# Patient Record
Sex: Female | Born: 1995 | Race: White | Hispanic: No | Marital: Single | Smoking: Never smoker
Health system: Southern US, Community
[De-identification: ages and names within clinical notes are randomized; demographics above are authoritative.]

## PROBLEM LIST (undated history)

## (undated) HISTORY — PX: DENTAL SURGERY: SHX609

## (undated) HISTORY — PX: MYRINGOTOMY WITH GELFILM: SHX6572

---
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
## 2017-02-05 ENCOUNTER — Encounter (HOSPITAL_COMMUNITY): Payer: Self-pay | Admitting: Emergency Medicine

## 2017-02-05 ENCOUNTER — Emergency Department (HOSPITAL_COMMUNITY)
Admission: EM | Admit: 2017-02-05 | Discharge: 2017-02-05 | Disposition: A | Discharge status: Home Health | Payer: Medicaid Other | Attending: Emergency Medicine | Admitting: Emergency Medicine

## 2017-02-05 DIAGNOSIS — K047 Periapical abscess without sinus: Secondary | ICD-10-CM

## 2017-02-05 DIAGNOSIS — K0889 Other specified disorders of teeth and supporting structures: Secondary | ICD-10-CM | POA: Diagnosis present

## 2017-02-05 MED ORDER — PENICILLIN V POTASSIUM 500 MG PO TABS: 500.0000 mg | ORAL_TABLET | Freq: Three times a day (TID) | ORAL | 0 refills | Status: DC

## 2017-02-05 MED ORDER — NAPROXEN 500 MG PO TABS: 500.0000 mg | ORAL_TABLET | Freq: Two times a day (BID) | ORAL | 0 refills | Status: DC

## 2017-02-05 MED ORDER — TRAMADOL HCL 50 MG PO TABS: 50.0000 mg | ORAL_TABLET | Freq: Four times a day (QID) | ORAL | 0 refills | Status: DC | PRN

## 2017-02-05 NOTE — Discharge Instructions (Signed)
Please read and follow all provided instructions.  Your diagnoses today include:  1. Abscess, dental     The exam and treatment you received today has been provided on an emergency basis only. This is not a substitute for complete medical or dental care.  Tests performed today include:  Vital signs. See below for your results today.   Medications prescribed:   Penicillin - antibiotic  You have been prescribed an antibiotic medicine: take the entire course of medicine even if you are feeling better. Stopping early can cause the antibiotic not to work.   Naproxen - anti-inflammatory pain medication  Do not exceed 500mg  naproxen every 12 hours, take with food  You have been prescribed an anti-inflammatory medication or NSAID. Take with food. Take smallest effective dose for the shortest duration needed for your pain. Stop taking if you experience stomach pain or vomiting.    Tramadol - narcotic-like pain medication  DO NOT drive or perform any activities that require you to be awake and alert because this medicine can make you drowsy.   Take any prescribed medications only as directed.  Home care instructions:  Follow any educational materials contained in this packet.  Follow-up instructions: Please follow-up with your dentist as planned for further evaluation of your symptoms.   Return instructions:   Please return to the Emergency Department if you experience worsening symptoms.  Please return if you develop a fever, you develop more swelling in your face or neck, you have trouble breathing or swallowing food.  Please return if you have any other emergent concerns.  Additional Information:  Your vital signs today were: BP 121/72 (BP Location: Right Arm)    Pulse 99    Temp 98.2 F (36.8 C) (Oral)    Resp 18    Ht 5\' 5"  (1.651 m)    Wt 59 kg (130 lb)    LMP 01/22/2017    SpO2 99%    BMI 21.63 kg/m  If your blood pressure (BP) was elevated above 135/85 this visit,  please have this repeated by your doctor within one month. --------------

## 2017-02-05 NOTE — ED Notes (Signed)
Pt states that her thyroid and throat is swollen wants the EDP to look at it before she leaves.  EDP notified and is at bedside.

## 2017-02-05 NOTE — ED Triage Notes (Signed)
PT c/o left upper dental pain with some facial swelling x2 days. PT states she has a dentist appt for Tuesday this upcoming week.

## 2017-02-05 NOTE — ED Provider Notes (Signed)
[facility]-EMERGENCY DEPT Provider Note   CSN: [id number] Arrival date & time: 02/05/17  1016     History   Chief Complaint Chief Complaint  Patient presents with  . Dental Pain    HPI Hannah Simon is a 21 y.o. female.  Patient presents with complaint of right upper gum and face swelling for 2 days, symptoms were worse this morning. No neck swelling, difficulty breathing, swallowing. No fevers or vomiting. Patient treating at home with over-the-counter medications. Patient has follow-up with dentist in 3 days.      History reviewed. No pertinent past medical history.  There are no active problems to display for this patient.   Past Surgical History:  Procedure Laterality Date  . DENTAL SURGERY    . MYRINGOTOMY WITH GELFILM      OB History    Gravida Para Term Preterm AB Living   0 0 0 0 0 0   SAB TAB Ectopic Multiple Live Births   0 0 0 0 0       Home Medications    Prior to Admission medications   Medication Sig Start Date End Date Taking? Authorizing Provider  naproxen (NAPROSYN) 500 MG tablet Take 1 tablet (500 mg total) by mouth 2 (two) times daily. 02/05/17   Renne Crigler, PA-C  penicillin v potassium (VEETID) 500 MG tablet Take 1 tablet (500 mg total) by mouth 3 (three) times daily. 02/05/17   Renne Crigler, PA-C  traMADol (ULTRAM) 50 MG tablet Take 1 tablet (50 mg total) by mouth every 6 (six) hours as needed. 02/05/17   Renne Crigler, PA-C    Family History History reviewed. No pertinent family history.  Social History Social History  Substance Use Topics  . Smoking status: Never Smoker  . Smokeless tobacco: Never Used  . Alcohol use No     Allergies   Patient has no known allergies.   Review of Systems Review of Systems  Constitutional: Negative for fever.  HENT: Positive for dental problem and facial swelling. Negative for ear pain, sore throat and trouble swallowing.   Respiratory: Negative for shortness of breath and stridor.     Musculoskeletal: Negative for neck pain.  Skin: Negative for color change.  Neurological: Negative for headaches.     Physical Exam Updated Vital Signs BP 121/72 (BP Location: Right Arm)   Pulse 99   Temp 98.2 F (36.8 C) (Oral)   Resp 18   Ht 5\' 5"  (1.651 m)   Wt 59 kg (130 lb)   LMP 01/22/2017   SpO2 99%   BMI 21.63 kg/m   Physical Exam  Constitutional: She appears well-developed and well-nourished.  HENT:  Head: Normocephalic and atraumatic.  Right Ear: Tympanic membrane, external ear and ear canal normal.  Left Ear: Tympanic membrane, external ear and ear canal normal.  Nose: Nose normal.  Mouth/Throat: Uvula is midline, oropharynx is clear and moist and mucous membranes are normal. No trismus in the jaw. Abnormal dentition. Dental abscesses (Pustule noted of gums at approximately tooth #11) and dental caries present. No uvula swelling. No tonsillar abscesses.  Trace left upper cheek swelling  Eyes: Conjunctivae are normal.  Neck: Normal range of motion. Neck supple.  No neck swelling or Ludwig's angina  Lymphadenopathy:    She has no cervical adenopathy.  Neurological: She is alert.  Skin: Skin is warm and dry.  Psychiatric: She has a normal mood and affect.  Nursing note and vitals reviewed.    ED Treatments / Results  Procedures Procedures (including critical care time)  Medications Ordered in ED Medications - No data to display   Initial Impression / Assessment and Plan / ED Course  I have reviewed the triage vital signs and the nursing notes.  Pertinent labs & imaging results that were available during my care of the patient were reviewed by me and considered in my medical decision making (see chart for details).     11:22 AM Patient seen and examined.   Vital signs reviewed and are as follows: BP 121/72 (BP Location: Right Arm)   Pulse 99   Temp 98.2 F (36.8 C) (Oral)   Resp 18   Ht 5\' 5"  (1.651 m)   Wt 59 kg (130 lb)   LMP 01/22/2017    SpO2 99%   BMI 21.63 kg/m   Patient counseled on use of narcotic pain medications. Counseled not to combine these medications with others containing tylenol. Urged not to drink alcohol, drive, or perform any other activities that requires focus while taking these medications. The patient verbalizes understanding and agrees with the plan.  Patient counseled to take prescribed medications as directed, return with worsening facial or neck swelling, and to follow-up with their dentist as soon as possible.    Final Clinical Impressions(s) / ED Diagnoses   Final diagnoses:  Abscess, dental   Patient with toothache. No fever. Exam unconcerning for Ludwig's angina or other deep tissue infection in neck.   As there is gum swelling, erythema, and facial swelling, will treat with antibiotic and pain medicine. Urged patient to follow-up with dentist.    New Prescriptions New Prescriptions   NAPROXEN (NAPROSYN) 500 MG TABLET    Take 1 tablet (500 mg total) by mouth 2 (two) times daily.   PENICILLIN V POTASSIUM (VEETID) 500 MG TABLET    Take 1 tablet (500 mg total) by mouth 3 (three) times daily.   TRAMADOL (ULTRAM) 50 MG TABLET    Take 1 tablet (50 mg total) by mouth every 6 (six) hours as needed.     Renne CriglerGeiple, Annahi Short, PA-C 02/05/17 1122    Pricilla LovelessGoldston, Scott, MD 02/14/17 [phone]

## 2019-06-20 ENCOUNTER — Telehealth: Payer: Self-pay | Admitting: Advanced Practice Midwife

## 2019-06-20 NOTE — Telephone Encounter (Signed)
We have you scheduled for an upcoming appointment at our office. At this time, we are still not allowing visitors or children during your appointment, however, a support person, over age 22, may accompany you to your appointment if assistance is needed for safety or care concerns. Otherwise, support persons should remain outside until the visit is complete.  ° °We ask if you have had any exposure to anyone suspected or confirmed of having COVID-19 or if you are experiencing any of the following, to call and reschedule your appointment: fever, cough, shortness of breath, muscle pain, diarrhea, rash, vomiting, abdominal pain, red eye, weakness, bruising, bleeding, joint pain, or a severe headache.  ° °Please know we will ask you these questions or similar questions when you arrive for your appointment and again it’s how we are keeping everyone safe.   ° °Also,to keep you safe, please use the provided hand sanitizer when you enter the office. We are asking everyone in the office to wear a mask to help prevent the spread of °germs. If you have a mask of your own, please wear it to your appointment, if not, we are happy to provide one for you. ° °Thank you for understanding and your cooperation.  ° ° °[facility] Staff °

## 2019-06-21 ENCOUNTER — Other Ambulatory Visit: Payer: Self-pay | Admitting: Advanced Practice Midwife

## 2019-12-23 ENCOUNTER — Encounter (HOSPITAL_COMMUNITY): Payer: Self-pay | Admitting: Emergency Medicine

## 2019-12-23 ENCOUNTER — Emergency Department (HOSPITAL_COMMUNITY)
Admission: EM | Admit: 2019-12-23 | Discharge: 2019-12-23 | Disposition: A | Discharge status: Home / Self Care | Payer: Medicaid Other | Attending: Emergency Medicine | Admitting: Emergency Medicine

## 2019-12-23 ENCOUNTER — Emergency Department (HOSPITAL_COMMUNITY): Payer: Medicaid Other

## 2019-12-23 ENCOUNTER — Other Ambulatory Visit: Payer: Self-pay

## 2019-12-23 DIAGNOSIS — N838 Other noninflammatory disorders of ovary, fallopian tube and broad ligament: Secondary | ICD-10-CM | POA: Insufficient documentation

## 2019-12-23 DIAGNOSIS — N949 Unspecified condition associated with female genital organs and menstrual cycle: Secondary | ICD-10-CM

## 2019-12-23 LAB — COMPREHENSIVE METABOLIC PANEL
ALT: 22 U/L (ref 0–44)
AST: 23 U/L (ref 15–41)
Albumin: 4.8 g/dL (ref 3.5–5.0)
Alkaline Phosphatase: 60 U/L (ref 38–126)
Anion gap: 9 (ref 5–15)
BUN: 9 mg/dL (ref 6–20)
CO2: 25 mmol/L (ref 22–32)
Calcium: 9.4 mg/dL (ref 8.9–10.3)
Chloride: 103 mmol/L (ref 98–111)
Creatinine, Ser: 0.59 mg/dL (ref 0.44–1.00)
GFR calc Af Amer: >60 mL/min (ref >60)
GFR calc non Af Amer: >60 mL/min (ref >60)
Glucose, Bld: 100 mg/dL — ABNORMAL HIGH (ref 70–99)
Potassium: 3.7 mmol/L (ref 3.5–5.1)
Sodium: 137 mmol/L (ref 135–145)
Total Bilirubin: 0.8 mg/dL (ref 0.3–1.2)
Total Protein: 7.6 g/dL (ref 6.5–8.1)

## 2019-12-23 LAB — URINALYSIS, ROUTINE W REFLEX MICROSCOPIC
Bilirubin Urine: NEGATIVE
Glucose, UA: NEGATIVE mg/dL
Hgb urine dipstick: NEGATIVE
Ketones, ur: NEGATIVE mg/dL
Leukocytes,Ua: NEGATIVE
Nitrite: NEGATIVE
Protein, ur: NEGATIVE mg/dL
Specific Gravity, Urine: 1.019 (ref 1.005–1.030)
pH: 5 (ref 5.0–8.0)

## 2019-12-23 LAB — CBC WITH DIFFERENTIAL/PLATELET
Abs Immature Granulocytes: 0.02 10*3/uL (ref 0.00–0.07)
Basophils Absolute: 0.1 10*3/uL (ref 0.0–0.1)
Basophils Relative: 1 %
Eosinophils Absolute: 0.2 10*3/uL (ref 0.0–0.5)
Eosinophils Relative: 2 %
HCT: 40.6 % (ref 36.0–46.0)
Hemoglobin: 13.4 g/dL (ref 12.0–15.0)
Immature Granulocytes: 0 %
Lymphocytes Relative: 34 %
Lymphs Abs: 2.8 10*3/uL (ref 0.7–4.0)
MCH: 27.8 pg (ref 26.0–34.0)
MCHC: 33 g/dL (ref 30.0–36.0)
MCV: 84.2 fL (ref 80.0–100.0)
Monocytes Absolute: 0.8 10*3/uL (ref 0.1–1.0)
Monocytes Relative: 10 %
Neutro Abs: 4.4 10*3/uL (ref 1.7–7.7)
Neutrophils Relative %: 53 %
Platelets: 379 10*3/uL (ref 150–400)
RBC: 4.82 MIL/uL (ref 3.87–5.11)
RDW: 13.4 % (ref 11.5–15.5)
WBC: 8.3 10*3/uL (ref 4.0–10.5)
nRBC: 0 % (ref 0.0–0.2)

## 2019-12-23 LAB — LIPASE, BLOOD: Lipase: 36 U/L (ref 11–51)

## 2019-12-23 LAB — I-STAT BETA HCG BLOOD, ED (MC, WL, AP ONLY): I-stat hCG, quantitative: <5 m[IU]/mL (ref <5)

## 2019-12-23 MED ORDER — IOHEXOL 300 MG/ML  SOLN
100.0000 mL | Freq: Once | INTRAMUSCULAR | Status: AC | PRN
  Administered 2019-12-23: 100 mL via INTRAVENOUS

## 2019-12-23 MED ORDER — FENTANYL CITRATE (PF) 100 MCG/2ML IJ SOLN
100.0000 ug | Freq: Once | INTRAMUSCULAR | Status: AC
  Administered 2019-12-23: 100 ug via INTRAVENOUS
  Filled 2019-12-23: qty 2

## 2019-12-23 MED ORDER — KETOROLAC TROMETHAMINE 30 MG/ML IJ SOLN
30.0000 mg | Freq: Once | INTRAMUSCULAR | Status: AC
  Administered 2019-12-23: 02:00:00 30 mg via INTRAVENOUS
  Filled 2019-12-23: qty 1

## 2019-12-23 NOTE — ED Triage Notes (Signed)
Pt C/O RLQ abdominal pain and nausea X 3 months. Denies fevers at home.

## 2019-12-23 NOTE — ED Provider Notes (Signed)
[facility] EMERGENCY DEPARTMENT Provider Note   CSN: [id number] Arrival date & time: 12/23/19  0023     History Chief Complaint  Patient presents with  . Abdominal Pain    Hannah Simon is a 24 y.o. female.  The history is provided by the patient.  Abdominal Pain Pain location:  RLQ Pain quality: aching and sharp   Pain radiates to:  Does not radiate Pain severity:  Mild Onset quality:  Gradual Duration: 3 months. Timing:  Intermittent Progression:  Worsening Chronicity:  Recurrent Relieved by:  None tried Worsened by:  Nothing Ineffective treatments:  None tried Associated symptoms: constipation and nausea   Associated symptoms: no chest pain, no cough, no diarrhea, no dysuria, no fever, no vaginal bleeding, no vaginal discharge and no vomiting   Associated symptoms comment:  Chronic constipation  Risk factors: has not had multiple surgeries   Patient reports episodes of right lower quadrant abdominal pain for the past 3 months.  This episode worsened approximately 6 days ago.  She felt that it worsened at night and she is concerned something will rupture.  No fevers or vomiting.  No dysuria.  No vaginal bleeding. She is not currently sexually active. No concern for STDs     PMH-none surg hx - no abdomimal surgeries Past Surgical History:  Procedure Laterality Date  . DENTAL SURGERY    . MYRINGOTOMY WITH GELFILM       OB History    Gravida  0   Para  0   Term  0   Preterm  0   AB  0   Living  0     SAB  0   TAB  0   Ectopic  0   Multiple  0   Live Births  0           No family history on file.  Social History   Tobacco Use  . Smoking status: Never Smoker  . Smokeless tobacco: Never Used  Substance Use Topics  . Alcohol use: No  . Drug use: No    Home Medications Prior to Admission medications   Not on File    Allergies    Patient has no known allergies.  Review of Systems   Review of Systems  Constitutional:  Negative for fever.  Respiratory: Negative for cough.   Cardiovascular: Negative for chest pain.  Gastrointestinal: Positive for abdominal pain, constipation and nausea. Negative for diarrhea and vomiting.  Genitourinary: Negative for dysuria, vaginal bleeding and vaginal discharge.  All other systems reviewed and are negative.   Physical Exam Updated Vital Signs BP (!) 144/79   Pulse (!) 102   Temp 98 F (36.7 C) (Oral)   Resp 17   Wt 61.2 kg   LMP 11/26/2019   SpO2 97%   BMI 22.47 kg/m   Physical Exam CONSTITUTIONAL: Well developed/well nourished, anxious HEAD: Normocephalic/atraumatic EYES: EOMI/PERRL ENMT: Mucous membranes moist NECK: supple no meningeal signs SPINE/BACK:entire spine nontender CV: S1/S2 noted, no murmurs/rubs/gallops noted LUNGS: Lungs are clear to auscultation bilaterally, no apparent distress ABDOMEN: soft, mild RLQ tenderness, no rebound or guarding, bowel sounds noted throughout abdomen GU:no cva tenderness NEURO: Pt is awake/alert/appropriate, moves all extremitiesx4.  No facial droop.  EXTREMITIES: pulses normal/equal, full ROM SKIN: warm, color normal PSYCH: Anxious  ED Results / Procedures / Treatments   Labs (all labs ordered are listed, but only abnormal results are displayed) Labs Reviewed  COMPREHENSIVE METABOLIC PANEL - Abnormal; Notable for the following  components:      Result Value   Glucose, Bld 100 (*)    All other components within normal limits  CBC WITH DIFFERENTIAL/PLATELET  LIPASE, BLOOD  URINALYSIS, ROUTINE W REFLEX MICROSCOPIC  I-STAT BETA HCG BLOOD, ED ([facility], [facility], [facility] ONLY)    EKG None  Radiology CT ABDOMEN PELVIS W CONTRAST  Result Date: 12/23/2019 CLINICAL DATA:  Right lower quadrant pain. EXAM: CT ABDOMEN AND PELVIS WITH CONTRAST TECHNIQUE: Multidetector CT imaging of the abdomen and pelvis was performed using the standard protocol following bolus administration of intravenous contrast. CONTRAST:  OMNIPAQUE  IOHEXOL 300 MG/ML  SOLN COMPARISON:  None. FINDINGS: Lower chest: No acute abnormality. Hepatobiliary: No focal liver abnormality is seen. No gallstones, gallbladder wall thickening, or biliary dilatation. Pancreas: Unremarkable. No pancreatic ductal dilatation or surrounding inflammatory changes. Spleen: Normal in size without focal abnormality. Adrenals/Urinary Tract: Adrenal glands are unremarkable. Kidneys are normal, without renal calculi, focal lesion, or hydronephrosis. Bladder is unremarkable. Stomach/Bowel: Stomach is within normal limits. Appendix appears normal. No evidence of bowel wall thickening, distention, or inflammatory changes. Vascular/Lymphatic: No significant vascular findings are present. No enlarged abdominal or pelvic lymph nodes. Reproductive: The uterus is unremarkable. A 1.4 cm x 0.8 cm partially collapsed cyst is seen along the right adnexa (axial CT image 69, CT series number 2). Other: No abdominal wall hernia or abnormality. No abdominopelvic ascites. Musculoskeletal: No acute or significant osseous findings. IMPRESSION: Small right adnexal cyst, likely ovarian in origin. Electronically Signed   By: Aram Candela M.D.   On: 12/23/2019 03:31    Procedures Procedures   Medications Ordered in ED Medications  ketorolac (TORADOL) 30 MG/ML injection 30 mg (30 mg Intravenous Given 12/23/19 0141)  fentaNYL (SUBLIMAZE) injection 100 mcg (100 mcg Intravenous Given 12/23/19 0242)  iohexol (OMNIPAQUE) 300 MG/ML solution 100 mL (100 mLs Intravenous Contrast Given 12/23/19 9528)    ED Course  I have reviewed the triage vital signs and the nursing notes.  Pertinent labs & imaging results that were available during my care of the patient were reviewed by me and considered in my medical decision making (see chart for details).    MDM Rules/Calculators/A&P                      1:04 AM Patient stable.  Labs pending.  She declines pain medicine 2:38 AM Patient had some increasing  pain he was given Toradol without any relief.  She now reports accelerating pain in her right lower quadrant and is a very anxious. I advised we will proceed with CT abdomen pelvis 3:54 AM No signs of acute appendicitis on CT scan.  She does have evidence of adnexal cyst.  This could be the cause of her pain.  Patient is feeling much improved.  She is ready for discharge home.  I counseled patient on close follow-up with OB/GYN in the next week.  She will likely need to have an outpatient ultrasound.  However her pain is dramatically improved, my suspicion for TOA/ovarian torsion is low.  At this time emergent ultrasound imaging is not required We discussed strict return precautions   This patient presents to the ED for concern of abdominal pain, this involves an extensive number of treatment options, and is a complaint that carries with it a high risk of complications and morbidity.  The differential diagnosis includes appendicitis, urinary tract infection, ovarian cyst, TOA, ovarian torsion, kidney stone   Lab Tests:   I Ordered, reviewed, and  interpreted labs, which included urinalysis, complete metabolic panel, CBC, lipase, pregnancy test  Medicines ordered:   I ordered medication fentanyl/Toradol for abdominal pain  Imaging Studies ordered:   I ordered imaging studies which included CT abdomen pelvis and  I independently visualized and interpreted imaging which showed adnexal cyst, no appendicitis     Reevaluation:  After the interventions stated above, I reevaluated the patient and found she is improved and ready for discharge       Final Clinical Impression(s) / ED Diagnoses Final diagnoses:  Adnexal cyst    Rx / DC Orders ED Discharge Orders    None       Zadie Rhine, MD 12/23/19 [phone]

## 2020-01-22 ENCOUNTER — Telehealth: Payer: Self-pay | Admitting: Obstetrics and Gynecology

## 2020-01-22 NOTE — Telephone Encounter (Signed)

## 2020-01-23 ENCOUNTER — Telehealth: Payer: Self-pay | Admitting: Obstetrics and Gynecology

## 2020-01-23 ENCOUNTER — Ambulatory Visit (INDEPENDENT_AMBULATORY_CARE_PROVIDER_SITE_OTHER): Payer: Medicaid Other | Admitting: Obstetrics and Gynecology

## 2020-01-23 DIAGNOSIS — Z5329 Procedure and treatment not carried out because of patient's decision for other reasons: Secondary | ICD-10-CM

## 2020-01-23 NOTE — Telephone Encounter (Signed)
No show for appt.  Unavailable by phone and voicemail is full.

## 2020-01-23 NOTE — Progress Notes (Signed)
No show for appt.
